# Patient Record
Sex: Male | Born: 1935 | Race: White | Hispanic: No | Smoking: Current some day smoker
Health system: Southern US, Community
[De-identification: ages and names within clinical notes are randomized; demographics above are authoritative.]

## PROBLEM LIST (undated history)

## (undated) DIAGNOSIS — I209 Angina pectoris, unspecified: Secondary | ICD-10-CM

## (undated) DIAGNOSIS — I639 Cerebral infarction, unspecified: Secondary | ICD-10-CM

## (undated) DIAGNOSIS — IMO0001 Reserved for inherently not codable concepts without codable children: Secondary | ICD-10-CM

## (undated) DIAGNOSIS — K219 Gastro-esophageal reflux disease without esophagitis: Secondary | ICD-10-CM

## (undated) DIAGNOSIS — I2510 Atherosclerotic heart disease of native coronary artery without angina pectoris: Secondary | ICD-10-CM

## (undated) DIAGNOSIS — Z789 Other specified health status: Secondary | ICD-10-CM

## (undated) DIAGNOSIS — I10 Essential (primary) hypertension: Secondary | ICD-10-CM

## (undated) DIAGNOSIS — J449 Chronic obstructive pulmonary disease, unspecified: Secondary | ICD-10-CM

## (undated) DIAGNOSIS — R911 Solitary pulmonary nodule: Secondary | ICD-10-CM

## (undated) HISTORY — PX: KNEE SURGERY: SHX244

## (undated) HISTORY — PX: BACK SURGERY: SHX140

## (undated) HISTORY — PX: OTHER SURGICAL HISTORY: SHX169

---

## 2009-04-05 ENCOUNTER — Emergency Department (HOSPITAL_COMMUNITY): Admission: EM | Admit: 2009-04-05 | Discharge: 2009-04-05 | Payer: Self-pay | Admitting: Emergency Medicine

## 2010-05-24 LAB — BASIC METABOLIC PANEL
CO2: 35 mEq/L — ABNORMAL HIGH (ref 19–32)
Calcium: 9.6 mg/dL (ref 8.4–10.5)
GFR calc non Af Amer: >60 mL/min (ref >60)
Glucose, Bld: 97 mg/dL (ref 70–99)

## 2010-05-24 LAB — CBC
HCT: 42.5 % (ref 39.0–52.0)
MCV: 94.8 fL (ref 78.0–100.0)
RBC: 4.48 MIL/uL (ref 4.22–5.81)
RDW: 13 % (ref 11.5–15.5)
WBC: 8.1 10*3/uL (ref 4.0–10.5)

## 2010-05-24 LAB — DIFFERENTIAL
Basophils Relative: 0 % (ref 0–1)
Eosinophils Relative: 1 % (ref 0–5)
Lymphocytes Relative: 24 % (ref 12–46)
Monocytes Absolute: 0.8 10*3/uL (ref 0.1–1.0)
Neutro Abs: 5.3 10*3/uL (ref 1.7–7.7)

## 2014-11-03 ENCOUNTER — Emergency Department (HOSPITAL_COMMUNITY): Payer: Medicare Other

## 2014-11-03 ENCOUNTER — Inpatient Hospital Stay (HOSPITAL_COMMUNITY)
Admission: EM | Admit: 2014-11-03 | Discharge: 2014-11-05 | DRG: 191 | Disposition: A | Discharge status: Home / Self Care | Payer: Medicare Other | Attending: Family Medicine | Admitting: Family Medicine

## 2014-11-03 ENCOUNTER — Encounter (HOSPITAL_COMMUNITY): Payer: Self-pay | Admitting: *Deleted

## 2014-11-03 DIAGNOSIS — J209 Acute bronchitis, unspecified: Secondary | ICD-10-CM | POA: Diagnosis present

## 2014-11-03 DIAGNOSIS — E876 Hypokalemia: Secondary | ICD-10-CM | POA: Diagnosis present

## 2014-11-03 DIAGNOSIS — Z720 Tobacco use: Secondary | ICD-10-CM

## 2014-11-03 DIAGNOSIS — R0789 Other chest pain: Secondary | ICD-10-CM | POA: Diagnosis not present

## 2014-11-03 DIAGNOSIS — J441 Chronic obstructive pulmonary disease with (acute) exacerbation: Secondary | ICD-10-CM | POA: Diagnosis present

## 2014-11-03 DIAGNOSIS — K219 Gastro-esophageal reflux disease without esophagitis: Secondary | ICD-10-CM | POA: Diagnosis present

## 2014-11-03 DIAGNOSIS — F17200 Nicotine dependence, unspecified, uncomplicated: Secondary | ICD-10-CM

## 2014-11-03 DIAGNOSIS — R918 Other nonspecific abnormal finding of lung field: Secondary | ICD-10-CM

## 2014-11-03 DIAGNOSIS — R911 Solitary pulmonary nodule: Secondary | ICD-10-CM

## 2014-11-03 DIAGNOSIS — I10 Essential (primary) hypertension: Secondary | ICD-10-CM | POA: Diagnosis present

## 2014-11-03 DIAGNOSIS — Z9981 Dependence on supplemental oxygen: Secondary | ICD-10-CM

## 2014-11-03 DIAGNOSIS — Z8673 Personal history of transient ischemic attack (TIA), and cerebral infarction without residual deficits: Secondary | ICD-10-CM | POA: Diagnosis not present

## 2014-11-03 DIAGNOSIS — F17210 Nicotine dependence, cigarettes, uncomplicated: Secondary | ICD-10-CM | POA: Diagnosis present

## 2014-11-03 DIAGNOSIS — J440 Chronic obstructive pulmonary disease with acute lower respiratory infection: Secondary | ICD-10-CM | POA: Diagnosis present

## 2014-11-03 DIAGNOSIS — R05 Cough: Secondary | ICD-10-CM | POA: Diagnosis not present

## 2014-11-03 DIAGNOSIS — Z809 Family history of malignant neoplasm, unspecified: Secondary | ICD-10-CM | POA: Diagnosis not present

## 2014-11-03 DIAGNOSIS — J9611 Chronic respiratory failure with hypoxia: Secondary | ICD-10-CM

## 2014-11-03 HISTORY — DX: Cerebral infarction, unspecified: I63.9

## 2014-11-03 HISTORY — DX: Reserved for inherently not codable concepts without codable children: IMO0001

## 2014-11-03 HISTORY — DX: Essential (primary) hypertension: I10

## 2014-11-03 HISTORY — DX: Other specified health status: Z78.9

## 2014-11-03 HISTORY — DX: Solitary pulmonary nodule: R91.1

## 2014-11-03 HISTORY — DX: Gastro-esophageal reflux disease without esophagitis: K21.9

## 2014-11-03 HISTORY — DX: Angina pectoris, unspecified: I20.9

## 2014-11-03 HISTORY — DX: Chronic obstructive pulmonary disease, unspecified: J44.9

## 2014-11-03 HISTORY — DX: Atherosclerotic heart disease of native coronary artery without angina pectoris: I25.10

## 2014-11-03 LAB — CBC WITH DIFFERENTIAL/PLATELET
BASOS PCT: 0 % (ref 0–1)
Basophils Absolute: 0 10*3/uL (ref 0.0–0.1)
EOS ABS: 0.3 10*3/uL (ref 0.0–0.7)
EOS PCT: 2 % (ref 0–5)
HCT: 44.9 % (ref 39.0–52.0)
Hemoglobin: 15.4 g/dL (ref 13.0–17.0)
LYMPHS ABS: 2.5 10*3/uL (ref 0.7–4.0)
Lymphocytes Relative: 19 % (ref 12–46)
MCH: 31.8 pg (ref 26.0–34.0)
MCHC: 34.3 g/dL (ref 30.0–36.0)
MCV: 92.8 fL (ref 78.0–100.0)
MONO ABS: 1.6 10*3/uL — AB (ref 0.1–1.0)
MONOS PCT: 12 % (ref 3–12)
NEUTROS PCT: 67 % (ref 43–77)
Neutro Abs: 9.1 10*3/uL — ABNORMAL HIGH (ref 1.7–7.7)
PLATELETS: 207 10*3/uL (ref 150–400)
RBC: 4.84 MIL/uL (ref 4.22–5.81)
RDW: 13.4 % (ref 11.5–15.5)
WBC: 13.4 10*3/uL — ABNORMAL HIGH (ref 4.0–10.5)

## 2014-11-03 LAB — BASIC METABOLIC PANEL
Anion gap: 10 (ref 5–15)
BUN: 15 mg/dL (ref 6–20)
CALCIUM: 9.6 mg/dL (ref 8.9–10.3)
CHLORIDE: 93 mmol/L — AB (ref 101–111)
CO2: 36 mmol/L — ABNORMAL HIGH (ref 22–32)
CREATININE: 0.78 mg/dL (ref 0.61–1.24)
GFR calc non Af Amer: >60 mL/min (ref >60)
Glucose, Bld: 105 mg/dL — ABNORMAL HIGH (ref 65–99)
Potassium: 3.3 mmol/L — ABNORMAL LOW (ref 3.5–5.1)
SODIUM: 139 mmol/L (ref 135–145)

## 2014-11-03 LAB — BRAIN NATRIURETIC PEPTIDE: B Natriuretic Peptide: 97 pg/mL (ref 0.0–100.0)

## 2014-11-03 LAB — TROPONIN I: Troponin I: <0.03 ng/mL (ref <0.031)

## 2014-11-03 MED ORDER — POTASSIUM CHLORIDE CRYS ER 20 MEQ PO TBCR
40.0000 meq | EXTENDED_RELEASE_TABLET | Freq: Once | ORAL | Status: AC
  Administered 2014-11-03: 40 meq via ORAL
  Filled 2014-11-03: qty 2

## 2014-11-03 MED ORDER — PANTOPRAZOLE SODIUM 40 MG PO TBEC
40.0000 mg | DELAYED_RELEASE_TABLET | Freq: Every day | ORAL | Status: DC
  Administered 2014-11-03 – 2014-11-05 (×3): 40 mg via ORAL
  Filled 2014-11-03 (×3): qty 1

## 2014-11-03 MED ORDER — HYDROCOD POLST-CPM POLST ER 10-8 MG/5ML PO SUER
5.0000 mL | Freq: Once | ORAL | Status: AC
  Administered 2014-11-03: 5 mL via ORAL
  Filled 2014-11-03: qty 5

## 2014-11-03 MED ORDER — INFLUENZA VAC SPLIT QUAD 0.5 ML IM SUSY
0.5000 mL | PREFILLED_SYRINGE | INTRAMUSCULAR | Status: AC
  Administered 2014-11-04: 0.5 mL via INTRAMUSCULAR
  Filled 2014-11-03: qty 0.5

## 2014-11-03 MED ORDER — SODIUM CHLORIDE 0.9 % IV SOLN: 250.0000 mL | INTRAVENOUS | Status: DC | PRN

## 2014-11-03 MED ORDER — IPRATROPIUM-ALBUTEROL 0.5-2.5 (3) MG/3ML IN SOLN
3.0000 mL | Freq: Once | RESPIRATORY_TRACT | Status: AC
  Administered 2014-11-03: 3 mL via RESPIRATORY_TRACT
  Filled 2014-11-03: qty 3

## 2014-11-03 MED ORDER — SODIUM CHLORIDE 0.9 % IJ SOLN
3.0000 mL | Freq: Two times a day (BID) | INTRAMUSCULAR | Status: DC
  Administered 2014-11-03 – 2014-11-04 (×4): 3 mL via INTRAVENOUS

## 2014-11-03 MED ORDER — ALBUTEROL SULFATE (2.5 MG/3ML) 0.083% IN NEBU
2.5000 mg | INHALATION_SOLUTION | Freq: Once | RESPIRATORY_TRACT | Status: DC
Start: 2014-11-03 — End: 2014-11-03
  Filled 2014-11-03: qty 3

## 2014-11-03 MED ORDER — SODIUM CHLORIDE 0.9 % IJ SOLN: 3.0000 mL | INTRAMUSCULAR | Status: DC | PRN

## 2014-11-03 MED ORDER — DOXYCYCLINE HYCLATE 100 MG PO TABS
100.0000 mg | ORAL_TABLET | Freq: Two times a day (BID) | ORAL | Status: DC
  Administered 2014-11-03 – 2014-11-05 (×5): 100 mg via ORAL
  Filled 2014-11-03 (×5): qty 1

## 2014-11-03 MED ORDER — ACETAMINOPHEN 325 MG PO TABS: 650.0000 mg | ORAL_TABLET | Freq: Four times a day (QID) | ORAL | Status: DC | PRN

## 2014-11-03 MED ORDER — GUAIFENESIN-DM 100-10 MG/5ML PO SYRP: 5.0000 mL | ORAL_SOLUTION | ORAL | Status: DC | PRN

## 2014-11-03 MED ORDER — METHYLPREDNISOLONE SODIUM SUCC 40 MG IJ SOLR
40.0000 mg | Freq: Two times a day (BID) | INTRAMUSCULAR | Status: DC
  Administered 2014-11-03 – 2014-11-05 (×4): 40 mg via INTRAVENOUS
  Filled 2014-11-03 (×4): qty 1

## 2014-11-03 MED ORDER — ALBUTEROL SULFATE (2.5 MG/3ML) 0.083% IN NEBU: 2.5000 mg | INHALATION_SOLUTION | RESPIRATORY_TRACT | Status: DC | PRN

## 2014-11-03 MED ORDER — ACETAMINOPHEN 650 MG RE SUPP: 650.0000 mg | Freq: Four times a day (QID) | RECTAL | Status: DC | PRN

## 2014-11-03 MED ORDER — IOHEXOL 300 MG/ML  SOLN
75.0000 mL | Freq: Once | INTRAMUSCULAR | Status: AC | PRN
  Administered 2014-11-03: 75 mL via INTRAVENOUS

## 2014-11-03 MED ORDER — METHYLPREDNISOLONE SODIUM SUCC 125 MG IJ SOLR
125.0000 mg | Freq: Once | INTRAMUSCULAR | Status: AC
Start: 2014-11-03 — End: 2014-11-03
  Administered 2014-11-03: 125 mg via INTRAVENOUS
  Filled 2014-11-03: qty 2

## 2014-11-03 MED ORDER — IPRATROPIUM-ALBUTEROL 0.5-2.5 (3) MG/3ML IN SOLN
3.0000 mL | Freq: Four times a day (QID) | RESPIRATORY_TRACT | Status: DC
  Administered 2014-11-03 – 2014-11-05 (×8): 3 mL via RESPIRATORY_TRACT
  Filled 2014-11-03 (×9): qty 3

## 2014-11-03 NOTE — H&P (Signed)
History and Physical  Marc Riley GEX:[id number] DOB: 06/22/1933 DOA: 11/03/2014  Referring physician: Ivery Quale, PA-C PCP: Joya Salm   Chief Complaint: Chest pain and cough  HPI: 27 yom PMH COPD presented with complaints of productive cough, SOB and chest pain. In the ED he was found to be mildly hypoxic. Also imaging revealed focal irregular nodular opacities in the apical segment of the right upper lung and several small on nodular opacities of the right and left lung. Will be admitted for further evaluation and treatment.  Reports chronic COPD, intermittent smoking, nocturnal oxygen use at home. Productive cough with chest pain onset last week with increasing shortness of breath over the last 3 days. The pain was described as cramp like, with a severity of 7-8/10, intermittent lasting a few seconds starting in his chest and radiating to the left arm in each episode. Has bilateral rib pain. Breathing treatments ineffective, but he does state when he gets sick he does not take medications as prescribed. Checks O2 at home and noted it was low. He complains of subjective fever, sore throat, leg pain. Denies n/v/d dysuria, abd pain, diarrhea.  In the emergency department VSS, Not hypoxic, and 87% on RA placed on 3L [place] with improvement to 95% Pertinent labs: Potassium 3.3, Glucose 105, WBC 13.4, BNP normal EKG: Independently reviewed. SR no acute changes Imaging: independently reviewed.  CT chest Underlying emphysema... focal irregular nodular opacity in the apical segment right upper lobe measuring 1.5 x 1.3 cm with a nearby 7 mm nodular opacity. Neoplasm in this area is of concern.   Review of Systems:  Positive for chest pain, SOB  Negative for fever, visual changes, sore throat, rash, new muscle aches, dysuria, bleeding, n/v/abdominal pain.  Past Medical History  Diagnosis Date  . Hypertension   . Reflux   . Angina pectoris   . Poor historian   . CAD (coronary artery  disease)   . Stroke     affected right eye vision  . COPD (chronic obstructive pulmonary disease)     on oxygen at night     Past Surgical History  Procedure Laterality Date  . Knee surgery    . Back surgery    . Left ear      Social History:  reports that he has been smoking Cigarettes.  He has been smoking about 0.50 packs per day. His smokeless tobacco use includes Chew. He reports that he drinks alcohol. His drug history is not on file. lives with their spouse Self-care  Allergies  Allergen Reactions  . Ivp Dye [Iodinated Diagnostic Agents] Hives and Rash    Family History  Problem Relation Age of Onset  . Cancer Brother   . Cancer Brother   . Cancer Brother   . Cancer Mother      Prior to Admission medications   Not on File  Cannot recall medications  Physical Exam: Filed Vitals:   11/03/14 0854 11/03/14 0900 11/03/14 1000 11/03/14 1100  BP:  110/56 152/64 135/61  Pulse:  59 71 80  Temp:      TempSrc:      Resp:  13 20   Height:      Weight:      SpO2: 87% 95% 93% 93%   General: Appears calm and comfortable Eyes: PERRL, normal lids, irises & conjunctiva. Wears glasses. ENT: grossly normal hearing, lips & tongue Neck: no LAD, masses or thyromegaly Cardiovascular: Distant RRR, no m/r/g. No LE edema. Respiratory: Wheezes all lung fields.  No r/r. Speaks in full sentences, normal respiratory effort. Abdomen: soft, ntnd Skin: no rash or induration noted Musculoskeletal: grossly normal tone BUE/BLE Psychiatric: grossly normal mood and affect, speech fluent and appropriate   Wt Readings from Last 3 Encounters:  11/03/14 92.08 kg (203 lb)    Labs on Admission:  Basic Metabolic Panel:  Recent Labs Lab 11/03/14 0830  NA 139  K 3.3*  CL 93*  CO2 36*  GLUCOSE 105*  BUN 15  CREATININE 0.78  CALCIUM 9.6   CBC:  Recent Labs Lab 11/03/14 0830  WBC 13.4*  NEUTROABS 9.1*  HGB 15.4  HCT 44.9  MCV 92.8  PLT 207    Radiological Exams on  Admission: Dg Chest 2 View  11/03/2014   CLINICAL DATA:  Productive cough. Congestion. Shortness of breath. Wheezing for 4 days. Worsening over the past 2 days.  EXAM: CHEST  2 VIEW  COMPARISON:  To 04/05/2009  FINDINGS: Healed right posterior third and fourth rib fractures.  Atherosclerotic calcification of the aortic arch.  Thoracic spondylosis.  Mild bilateral interstitial accentuation, possible underlying emphysema. 1.2 cm nodular density projects over approximately the T10 vertebra on the lateral projection, not well correlated on the frontal projection.  IMPRESSION: 1. New 1.2 cm nodular density projects over the lower lobes on the lateral projection. Possibly a calcified granuloma or calcified structure, but I cannot completely exclude an intrapulmonary nodule. CT the chest is recommended for further workup to exclude malignancy. 2. Suspected emphysema.  Faint interstitial accentuation. 3. Healed right upper rib fractures.   Electronically Signed   By: Gaylyn Rong M.D.   On: 11/03/2014 08:34   Ct Chest W Contrast  11/03/2014   CLINICAL DATA:  Productive cough and chest pain. Nodular opacity seen on chest radiograph  EXAM: CT CHEST WITH CONTRAST  TECHNIQUE: Multidetector CT imaging of the chest was performed during intravenous contrast administration.  CONTRAST:  75mL OMNIPAQUE IOHEXOL 300 MG/ML  SOLN  COMPARISON:  Chest radiograph November 03, 2014  FINDINGS: There is underlying centrilobular emphysema. There is and irregular pulmonary nodular opacity in the apical  segment of the right upper lobe measuring 1.5 x 1.3 cm, best seen on axial slice 11 series 3. On axial slice 9 series 3, there is a 7 x 7 mm slightly irregular nodular opacity in the apical segment right upper lobe as well. On axial slice 10 series 3, there is a 2 mm nodular opacity in the apical segment of the right upper lobe. On axial slice 24 series 3, there is a 5 mm nodular opacity in the anterior segment of the left upper lobe. On  axial slice 34 series 3, there is a 3 mm nodular opacity in the anterior segment of the right upper lobe. On axial slice 39 series 3, there is a 2 mm nodular opacity in the superior segment of the right lower lobe. On axial slice 47 series 3, there is a 5 x 2 mm nodular opacity in the medial segment right middle lobe. There is pleural thickening and scarring in the anterior left base region. There are scattered areas of mild pleural thickening in the upper lobe regions.  There is no appreciable thoracic adenopathy. Thyroid appears normal. There is atherosclerotic change in the aorta without aneurysm or dissection. There are foci of coronary artery calcification. No major vessel pulmonary embolus. Pericardium is not thickened.  In the visualized upper abdomen, there is atherosclerotic change in aorta. Visualized upper abdominal structures otherwise appear unremarkable. Adrenals in  particular appear normal. There is degenerative change in thoracic spine. No blastic or lytic bone lesions are identified on this study.  IMPRESSION: Underlying emphysema. No nodular opacity is seen in the region of the nodular opacity noted on the chest radiograph. However, there is a focal irregular nodular opacity in the apical segment right upper lobe measuring 1.5 x 1.3 cm with a nearby 7 mm nodular opacity. Neoplasm in this area is of concern. Several smaller nodular opacities are of uncertain etiology but potentially could represent small neoplastic foci. The dominant mass in the right upper lobe apical segment may well warrant correlation with nuclear medicine PET study to further evaluate given its irregular contour.  No lung edema or consolidation. No adenopathy appreciable. No adrenal lesions. Atherosclerotic change with foci of coronary artery calcification noted.   Electronically Signed   By: Bretta Bang III M.D.   On: 11/03/2014 10:13      Principal Problem:   COPD exacerbation Active Problems:   Chronic  respiratory failure with hypoxia   Atypical chest pain   Lung nodule   Tobacco use disorder   Assessment/Plan 1. COPD exacerbation with acute bronchitis. No evidence of pneumonia or edema on chest CT. 2. Chronic hypoxic respiratory failure on [place] at home. 3. Atypical chest pain, EKG non acute, no history to suggest ACS. Suspect related to cough. 4. Hypokalemia  5. Lung nodule RUL with concern for neoplasm. Consider PET study as an outpatient. 6. Tobacco use disorder   Plan admission to medical floor  Steroids, supplemental oxygen, abx  Telemetry, check troponin  Replete potassium   Outpatient follow-up for lung nodule  Code Status: Full  DVT prophylaxis:SCDs Family Communication: Discussed with patient who understands and has no concerns at this time. Disposition Plan/Anticipated LOS: Admitted to medical bed.  Time spent: 50 minutes  [name], MD  [facility] Pager [phone] 11/03/2014, 11:34 AM

## 2014-11-03 NOTE — ED Provider Notes (Signed)
CSN: [id number]     Arrival date & time 11/03/14  0751 History   First MD Initiated Contact with Patient 11/03/14 [phone]     Chief Complaint  Patient presents with  . Chest Pain  . Cough     (Consider location/radiation/quality/duration/timing/severity/associated sxs/prior Treatment) HPI Comments: Patient is a 81 year old male who presents to the emergency department with a complaint of cough and chest pain.  The patient states that about 6 or 7 days ago he began having cold symptoms with nasal congestion, cough, and some wheezing. He states he has a history of COPD per his physician in [place]. He states this is been getting progressively worse. He now has cough, with white to yellow phlegm. He states he has not measured a temperature elevation, but has had episodes of sweating and some chills. He now complains of soreness in his ribs on, as well as a pressure sensation across his chest that his aggravated by cough. He denies any syncopal episodes. He denies any hemoptysis. He denies any swelling of his lower extremities. He reports history of injury to his chest area with some broken ribs, but otherwise no surgical procedures or injury to the chest or lungs. It is of note that he has been diagnosed in the past with angina.  The history is provided by the patient and a friend.    Past Medical History  Diagnosis Date  . Hypertension   . Reflux   . Angina pectoris   . Poor historian    Past Surgical History  Procedure Laterality Date  . Knee surgery    . Back surgery     No family history on file. Social History  Substance Use Topics  . Smoking status: Current Every Day Smoker -- 0.50 packs/day    Types: Cigarettes  . Smokeless tobacco: Current User    Types: Chew  . Alcohol Use: Yes     Comment: seldom    Review of Systems  Constitutional: Positive for chills and appetite change.  HENT: Positive for congestion.   Respiratory: Positive for cough and chest tightness.    Musculoskeletal: Positive for back pain and arthralgias.  All other systems reviewed and are negative.     Allergies  Review of patient's allergies indicates not on file.  Home Medications   Prior to Admission medications   Not on File   BP 157/72 mmHg  Pulse 76  Temp(Src) 98.7 F (37.1 C) (Oral)  Resp 24  Ht 6' (1.829 m)  Wt 203 lb (92.08 kg)  BMI 27.53 kg/m2  SpO2 95% Physical Exam  Constitutional: He is oriented to person, place, and time. He appears well-developed and well-nourished.  Non-toxic appearance.  HENT:  Head: Normocephalic.  Right Ear: Tympanic membrane and external ear normal.  Left Ear: Tympanic membrane and external ear normal.  Mild congestion present.  Eyes: EOM and lids are normal. Pupils are equal, round, and reactive to light.  Neck: Normal range of motion. Neck supple. Carotid bruit is not present.  Cardiovascular: Normal rate, regular rhythm, intact distal pulses and normal pulses.   Murmur heard. 2/6 systolic murmur appreciated. No rub appreciated. No gallop noted.  Pulmonary/Chest: Breath sounds normal. No respiratory distress.  There is symmetrical rise and fall of the chest. The patient speaks in complete sentences. There are bilateral wheezes and some rhonchi scattered. There is soreness with deep breathing, and soreness to palpation of the right and left lung areas.   Abdominal: Soft. Bowel sounds are normal. He exhibits  no distension and no mass. There is no tenderness. There is no guarding.  Musculoskeletal: Normal range of motion. He exhibits edema and tenderness.  Negative Homans sign  Lymphadenopathy:       Head (right side): No submandibular adenopathy present.       Head (left side): No submandibular adenopathy present.    He has no cervical adenopathy.  Neurological: He is alert and oriented to person, place, and time. He has normal strength. No cranial nerve deficit or sensory deficit.  Gait is slow but steady. No gross  neurologic deficits appreciated.  Skin: Skin is warm and dry.  Psychiatric: He has a normal mood and affect. His speech is normal.  Nursing note and vitals reviewed.   ED Course  Procedures (including critical care time) Labs Review Labs Reviewed - No data to display  Imaging Review No results found. I have personally reviewed and evaluated these images and lab results as part of my medical decision-making.   EKG Interpretation None      MDM  Vital signs reviewed. Pulse oximetry low at 87% on room air. Patient improved to 94-95% on 3 L of oxygen by nasal cannula.  Complete blood count reveals an elevated white blood cell count of 13,400. There is no noted shift to the left. The chest x-ray suggest a 1.2 cm nodular density projecting over the lower lobes of the lateral aspect of the lung. There is also suspected emphysema with faint interstitial accentuation. A CT scan of the chest is suggested for further workup concerning this nodular density.   The potassium is slightly low at 3.3, the CO2 is high at 36, the remainder of the basic metabolic panels within normal limits. The CT scan suggests underlying emphysema. There are focal irregular nodular opacities in the apical segment of the right upper lung. And there is question of neoplasm in this area. There is several small on nodular opacities of the right and left lung also of some concern. There is no lung edema appreciated. There is calcification noted in the coronary arteries, but no aneurysmal changes appreciated.  I have discussed these findings with the patient and the patient's family/friend in terms which they understand. Of concern is the fact that the patient had a hypoxia of 87% on room air on admission, and has required oxygen by nasal cannula during his stay here in the emergency department. This is compounded by the fact that the patient has documented lesions in the right and left lung, with mild CO2 retention, and advanced  age. We'll discuss the patient with hospitalist for admission.    Final diagnoses:  COPD exacerbation  Mass of lung    *I have reviewed nursing notes, vital signs, and all appropriate lab and imaging results for this patient.[address], PA-C 11/03/14 1144  Benjiman Core, MD 11/04/14 1159

## 2014-11-03 NOTE — ED Notes (Signed)
Pt states cold symptoms began ~6 days ago. States productive cough at times, yellow in color. Pt states pressure across chest x 2 days along with rib pain.

## 2014-11-04 DIAGNOSIS — J441 Chronic obstructive pulmonary disease with (acute) exacerbation: Secondary | ICD-10-CM | POA: Diagnosis not present

## 2014-11-04 LAB — TROPONIN I

## 2014-11-04 MED ORDER — POLYETHYLENE GLYCOL 3350 17 G PO PACK
17.0000 g | PACK | Freq: Two times a day (BID) | ORAL | Status: DC
  Filled 2014-11-04 (×2): qty 1

## 2014-11-04 NOTE — Care Management Important Message (Signed)
Important Message  Patient Details  Name: Marc Riley MRN: [id number] Date of Birth: April 25, 1933   Medicare Important Message Given:  Yes-second notification given    Malcolm Metro, RN 11/04/2014, 1:27 PM

## 2014-11-04 NOTE — Progress Notes (Signed)
PROGRESS NOTE  Marc Riley ZOX:[id number] DOB: [birth year] DOA: 11/03/2014 PCP: Joya Salm  Summary: 24 yom PMH COPD presented with complaints of productive cough, SOB and chest pain. In the ED he was found to be mildly hypoxic. Also imaging revealed focal irregular nodular opacities in the apical segment of the right upper lung and several small on nodular opacities of the right and left lung. Will be admitted for further evaluation and treatment.  Assessment/Plan: 1. COPD exacerbation with acute bronchitis, no change today. No evidence of pneumonia or edema on chest CT. 2. Chronic hypoxic respiratory failure on [place] at home. Stable. 3. Atypical chest pain, EKG non acute, no history to suggest ACS. Suspect related to cough. Troponin negative and telemetry unrevealing. 4. Lung nodule RUL with concern for neoplasm. Consider PET study as an outpatient. 5. Tobacco use disorder   Somewhat worse today. Continue abx, steroids, and bronchodilators.   Discontinue telemetry.  Anticipate will need 48 hours before discharge.  Code Status: Full  DVT prophylaxis: SCDs Family Communication: Discussed with patient who understands and has no concerns at this time.  Disposition Plan: Anticipate will need 48 hours before discharge.  [name], MD  [facility]  Pager [phone] If 7PM-7AM, please contact night-coverage at [email or web address], password [facility] 11/04/2014, 6:38 AM  LOS: 1 day   Consultants:    Procedures:    Antibiotics:  Doxycycline 9/1>>  HPI/Subjective: Feels worse. Cough has increased without being able to bring up anything, His chest feels tight and SOB is worse.   Objective: Filed Vitals:   11/03/14 1647 11/03/14 2015 11/03/14 2142 11/04/14 0409  BP:   121/57 118/61  Pulse:   91 78  Temp:   98.1 F (36.7 C) 97.9 F (36.6 C)  TempSrc:   Oral Oral  Resp:   18 18  Height:      Weight:      SpO2: 98% 95% 96% 89%    Intake/Output Summary (Last 24 hours)  at 11/04/14 8295 Last data filed at 11/03/14 2259  Gross per 24 hour  Intake    243 ml  Output      0 ml  Net    243 ml     Filed Weights   11/03/14 0800 11/03/14 1526  Weight: 92.08 kg (203 lb) 88.315 kg (194 lb 11.2 oz)    Exam:  Afebrile, mild hypoxia on   General:  Appears calm and comfortable Cardiovascular: RRR, no m/r/g. No LE edema. Telemetry: SR Respiratory: Diffuse wheezes on all lung fields, poor air movement and mild to moderate increased respiratory efforts. No r/r. Psychiatric: grossly normal mood and affect, speech fluent and appropriate  New data reviewed:  Troponin negative  Pertinent data since admission:  CT Chest underlying emphysema. Focal irregular nodular opacity in the apical segment right upper lobe measuring 1.5 x 1.3 cm with a nearby 7 mm nodular opacity.   CXR: New 1.2 cm nodular density projects over the lower lobes on the lateral projection. Possible a calcified granuloma or calcified structure. Cannot completely exclude and intrapulmonary nodule. 2. Suspect emphysema. Faint interstitial asscentuation. 3. Healed right upper rib fracture.   Pending data:    Scheduled Meds: . doxycycline  100 mg Oral Q12H  . Influenza vac split quadrivalent PF  0.5 mL Intramuscular Tomorrow-1000  . ipratropium-albuterol  3 mL Nebulization QID  . methylPREDNISolone (SOLU-MEDROL) injection  40 mg Intravenous Q12H  . pantoprazole  40 mg Oral Daily  . sodium chloride  3 mL Intravenous Q12H  Continuous Infusions:   Principal Problem:   COPD exacerbation Active Problems:   Chronic respiratory failure with hypoxia   Atypical chest pain   Lung nodule   Tobacco use disorder   Time spent 25 minutes  I, Jessica D. Jari Pigg, acting as scribe, recorded this note contemporaneously in the presence of Dr. Melton Alar. Irene Limbo, M.D. on 11/04/2014 .  I have reviewed the above documentation for accuracy and completeness, and I agree with the above. [name], MD

## 2014-11-04 NOTE — Care Management Note (Signed)
Case Management Note  Patient Details  Name: Annette Liotta MRN: [id number] Date of Birth: 18-Dec-1933  Expected Discharge Date:    11/07/2014              Expected Discharge Plan:  Home/Self Care  In-House Referral:  NA  Discharge planning Services  CM Consult  Post Acute Care Choice:  NA Choice offered to:  NA  DME Arranged:    DME Agency:     HH Arranged:    HH Agency:     Status of Service:  Completed, signed off  Medicare Important Message Given:    Date Medicare IM Given:    Medicare IM give by:    Date Additional Medicare IM Given:    Additional Medicare Important Message give by:     If discussed at Long Length of Stay Meetings, dates discussed:    Additional Comments: Pt is from home, lives with his daughter and is independent at baseline. Pt has home O2, neb machine and walker. Pt plans to return home with self care at DC. DC is anticipated over weekend. No CM needs noted.  Malcolm Metro, RN 11/04/2014, 1:26 PM

## 2014-11-05 MED ORDER — DOXYCYCLINE HYCLATE 100 MG PO TABS: 100.0000 mg | ORAL_TABLET | Freq: Two times a day (BID) | ORAL | Status: AC

## 2014-11-05 MED ORDER — PREDNISONE 10 MG PO TABS: ORAL_TABLET | ORAL | Status: AC

## 2014-11-05 MED ORDER — ALBUTEROL SULFATE HFA 108 (90 BASE) MCG/ACT IN AERS
2.0000 | INHALATION_SPRAY | Freq: Four times a day (QID) | RESPIRATORY_TRACT | Status: AC | PRN
Start: 2014-11-05 — End: ?

## 2014-11-05 MED ORDER — TIOTROPIUM BROMIDE MONOHYDRATE 18 MCG IN CAPS: 18.0000 ug | ORAL_CAPSULE | Freq: Every day | RESPIRATORY_TRACT | Status: AC

## 2014-11-05 NOTE — Discharge Summary (Signed)
Physician Discharge Summary  Marc Riley ZOX:[id number] DOB: 03-08-1933 DOA: 11/03/2014  PCP: Marc Riley  Admit date: 11/03/2014 Discharge date: 11/05/2014  Recommendations for Outpatient Follow-up:   Follow up with PCP in 1 week for resolution of COPD exacerbation and for reported BLE edema at home. Will hold Norvasc for now. Finish oral Doxycycline 9/7 and steroid taper.   Continue supplemental oxygen at home.  Consider PET study as an outpatient for RUL nodule with some concern for malignancy.   Follow-up Information    Follow up with Riley, Marc. Schedule an appointment as soon as possible for a visit in 1 week.   Specialty:  Family Medicine   Contact information:   [address] [place] [address] [phone]      Discharge Diagnoses:  1. COPD exacerbation with acute bronchitis, 2. Chronic hypoxic respiratory failure on [place] at home.. 3. Atypical chest pain 4. Lung nodule RUL with concern for neoplasm.  5. Tobacco use disorder  Discharge Condition: Improved  Disposition: Home  Diet recommendation: Heart healthy   Filed Weights   11/03/14 0800 11/03/14 1526  Weight: 92.08 kg (203 lb) 88.315 kg (194 lb 11.2 oz)    History of present illness:  87 yom PMH COPD presented with complaints of productive cough, SOB and chest pain. In the ED he was found to be mildly hypoxic. Also imaging revealed focal irregular nodular opacities in the apical segment of the right upper lung and several small on nodular opacities of the right and left lung. Will be admitted for further evaluation and treatment.  Hospital Course:  Marc Riley was treated with doxcycline, steroids, and bronchodilators with gradual improvement in COPD exacerbation. Atypical chest pain was suspected to be due to cough, there was no history to suggest ACS and all other cardiac workup was unremarkable. Reported BLE edema at home but spontaneously resolved with holding of Norvasc. Will discontinue  Norvasc as outpatient until follow up with PCP for further evaluation. RUL nodule noted with concern for neoplasm, consider PET study as outpatient. All other issues remained stable.   Individual issues as below:  1. COPD exacerbation with acute bronchitis, improving. No evidence of pneumonia or edema on chest CT. 2. Chronic hypoxic respiratory failure on [place] at home. Remains stable. 3. Atypical chest pain, EKG non acute, no history to suggest ACS. Suspect related to cough. Troponin negative and telemetry unrevealing. No further evaluation suggested.  4. Lung nodule RUL with concern for neoplasm. Consider PET study as an outpatient. 5. Tobacco use disorder. Counseled on tobacco cessation.  Consultants:  None   Procedures:  None  Antibiotics:  Doxycycline 9/1>> 9/7  Discharge Instructions Discharge Instructions    Activity as tolerated - No restrictions    Complete by:  As directed      Diet general    Complete by:  As directed      Discharge instructions    Complete by:  As directed   Call your physician or seek immediate medical attention for shortness of breath, increased wheezing, fever, weakness or worsening of condition.            Current Discharge Medication List    START taking these medications   Details  albuterol (PROVENTIL HFA;VENTOLIN HFA) 108 (90 BASE) MCG/ACT inhaler Inhale 2 puffs into the lungs every 6 (six) hours as needed for wheezing or shortness of breath. Please instruct in proper technique Qty: 1 Inhaler, Refills: 0    doxycycline (VIBRA-TABS) 100 MG tablet Take 1  tablet (100 mg total) by mouth every 12 (twelve) hours. Qty: 9 tablet, Refills: 0    predniSONE (DELTASONE) 10 MG tablet Take 40 mg by mouth daily for 3 days, then take 20 mg by mouth daily for 3 days, then take 10 mg by mouth daily for 3 days, then stop. Qty: 21 tablet, Refills: 0    tiotropium (SPIRIVA HANDIHALER) 18 MCG inhalation capsule Place 1 capsule (18 mcg total) into inhaler  and inhale daily. Instruct in proper technique. Qty: 30 capsule, Refills: 0      CONTINUE these medications which have NOT CHANGED   Details  atorvastatin (LIPITOR) 20 MG tablet Take 1 tablet by mouth daily.    clonazePAM (KLONOPIN) 0.5 MG tablet Take 1-2 tablets by mouth at bedtime.     diclofenac (CATAFLAM) 50 MG tablet Take 1 tablet by mouth 3 (three) times daily as needed (pain).     DULoxetine (CYMBALTA) 20 MG capsule Take 1 capsule by mouth at bedtime.     hydrochlorothiazide (HYDRODIURIL) 25 MG tablet Take 1 tablet by mouth daily.    LIDOCAINE EX Apply 1 application topically 4 (four) times daily as needed (pain). Compounded with lidocaine, gabapentin, and ketoprofen.    methocarbamol (ROBAXIN) 750 MG tablet Take 750 mg by mouth 2 (two) times daily.     metoprolol succinate (TOPROL-XL) 50 MG 24 hr tablet Take 50 mg by mouth daily. Take with or immediately following a meal.    omeprazole (PRILOSEC) 20 MG capsule Take 1 capsule by mouth daily.      STOP taking these medications     amLODipine (NORVASC) 10 MG tablet        Allergies  Allergen Reactions  . Ivp Dye [Iodinated Diagnostic Agents] Hives and Rash    The results of significant diagnostics from this hospitalization (including imaging, microbiology, ancillary and laboratory) are listed below for reference.    Significant Diagnostic Studies: Dg Chest 2 View  11/03/2014   CLINICAL DATA:  Productive cough. Congestion. Shortness of breath. Wheezing for 4 days. Worsening over the past 2 days.  EXAM: CHEST  2 VIEW  COMPARISON:  To 04/05/2009  FINDINGS: Healed right posterior third and fourth rib fractures.  Atherosclerotic calcification of the aortic arch.  Thoracic spondylosis.  Mild bilateral interstitial accentuation, possible underlying emphysema. 1.2 cm nodular density projects over approximately the T10 vertebra on the lateral projection, not well correlated on the frontal projection.  IMPRESSION: 1. New 1.2 cm  nodular density projects over the lower lobes on the lateral projection. Possibly a calcified granuloma or calcified structure, but I cannot completely exclude an intrapulmonary nodule. CT the chest is recommended for further workup to exclude malignancy. 2. Suspected emphysema.  Faint interstitial accentuation. 3. Healed right upper rib fractures.   Electronically Signed   By: Walter  Liebkemann M.D.   On: 11/03/2014 08:34   Ct Chest W Contrast  11/03/2014   CLINICAL DATA:  Productive cough and chest pain. Nodular opacity seen on chest radiograph  EXAM: CT CHEST WITH CONTRAST  TECHNIQUE: Multidetector CT imaging of the chest was performed during intravenous contrast administration.  CONTRAST:  43mRenaLon54mlRenaLon3mlRenaLon5mlRenaLon61mlRenaLon63mlRenaLon54mlRenaLon52mlRenaLon54mlRenaLon36mlRenaLon31mlRenaLon81mlRenaLon13mlRenaLon61mlRenaLon73mlRenaLon19mlRenaLon11mlRenaLon62mlRenaLonellLoren RacerIOHEXOL 300 MG/ML  SOLN  COMPARISON:  Chest radiograph November 03, 2014  FINDINGS: There is underlying centrilobular emphysema. There is and irregular pulmonary nodular opacity in the apical  segment of the right upper lobe measuring 1.5 x 1.3 cm, best seen on axial slice 11 series 3. On axial slice 9 series 3, there is a 7 x 7  mm slightly irregular nodular opacity in the apical segment right upper lobe as well. On axial slice 10 series 3, there is a 2 mm nodular opacity in the apical segment of the right upper lobe. On axial slice 24 series 3, there is a 5 mm nodular opacity in the anterior segment of the left upper lobe. On axial slice 34 series 3, there is a 3 mm nodular opacity in the anterior segment of the right upper lobe. On axial slice 39 series 3, there is a 2 mm nodular opacity in the superior segment of the right lower lobe. On axial slice 47 series 3, there is a 5 x 2 mm nodular opacity in the medial segment right middle lobe. There is pleural thickening and scarring in the anterior left base region. There are scattered areas of mild pleural thickening in the upper lobe regions.  There is no appreciable thoracic adenopathy. Thyroid appears normal. There is atherosclerotic change in the aorta  without aneurysm or dissection. There are foci of coronary artery calcification. No major vessel pulmonary embolus. Pericardium is not thickened.  In the visualized upper abdomen, there is atherosclerotic change in aorta. Visualized upper abdominal structures otherwise appear unremarkable. Adrenals in particular appear normal. There is degenerative change in thoracic spine. No blastic or lytic bone lesions are identified on this study.  IMPRESSION: Underlying emphysema. No nodular opacity is seen in the region of the nodular opacity noted on the chest radiograph. However, there is a focal irregular nodular opacity in the apical segment right upper lobe measuring 1.5 x 1.3 cm with a nearby 7 mm nodular opacity. Neoplasm in this area is of concern. Several smaller nodular opacities are of uncertain etiology but potentially could represent small neoplastic foci. The dominant mass in the right upper lobe apical segment may well warrant correlation with nuclear medicine PET study to further evaluate given its irregular contour.  No lung edema or consolidation. No adenopathy appreciable. No adrenal lesions. Atherosclerotic change with foci of coronary artery calcification noted.   Electronically Signed   By: Bretta Bang III M.D.   On: 11/03/2014 10:13    Labs: Basic Metabolic Panel:  Recent Labs Lab 11/03/14 0830  NA 139  K 3.3*  CL 93*  CO2 36*  GLUCOSE 105*  BUN 15  CREATININE 0.78  CALCIUM 9.6  CBC:  Recent Labs Lab 11/03/14 0830  WBC 13.4*  NEUTROABS 9.1*  HGB 15.4  HCT 44.9  MCV 92.8  PLT 207   Cardiac Enzymes:  Recent Labs Lab 11/03/14 0830 11/03/14 2047 11/04/14 0316  TROPONINI <0.03 <0.03 <0.03      Recent Labs  11/03/14 0830  BNP 97.0   Principal Problem:   COPD exacerbation Active Problems:   Chronic respiratory failure with hypoxia   Atypical chest pain   Lung nodule   Tobacco use disorder   Time coordinating discharge: 35 minutes   Signed:  [name], MD [facility] 11/05/2014, 10:21 AM   I, Princella Pellegrini. Jari Pigg, acting as scribe, recorded this note contemporaneously in the presence of Dr. Melton Alar. Irene Limbo, M.D. on 11/05/2014 .  I have reviewed the above documentation for accuracy and completeness, and I agree with the above. [name], MD

## 2014-11-05 NOTE — Progress Notes (Signed)
PROGRESS NOTE  Marc Riley ZOX:[id number] DOB: [birth year] DOA: 11/03/2014 PCP: Joya Salm  Summary: 89 yom PMH COPD presented with complaints of productive cough, SOB and chest pain. In the ED he was found to be mildly hypoxic. Also imaging revealed focal irregular nodular opacities in the apical segment of the right upper lung and several small on nodular opacities of the right and left lung. Will be admitted for further evaluation and treatment.  Assessment/Plan: 1. COPD exacerbation with acute bronchitis, improving. No evidence of pneumonia or edema on chest CT. 2. Chronic hypoxic respiratory failure on [place] at home. Remains stable. 3. Atypical chest pain, EKG non acute, no history to suggest ACS. Suspect related to cough. Troponin negative and telemetry unrevealing. No further evaluation suggested.  4. Lung nodule RUL with concern for neoplasm. Consider PET study as an outpatient. 5. Tobacco use disorder. Counseled on tobacco cessation.    Overall improving.   Discharge home today on steriod taper  Continue supplemental oxygen at home.  Finish oral Doxycycline   Code Status: Full  DVT prophylaxis: SCDs Family Communication: Discussed with patient who understands and has no concerns at this time. Disposition Plan: Discharge home today  Marc Sacks, MD  [facility]  Pager [phone] If 7PM-7AM, please contact night-coverage at [email or web address], password [facility] 11/05/2014, 10:16 AM  LOS: 2 days   Consultants:    Procedures:    Antibiotics:  Doxycycline 9/1>> 9/7  HPI/Subjective: Feeling a lot better today and wants to go home. No reports of chest pain, n/v/d, or abd pain. Breathing ok. Ambulating to bathroom ok.  Objective: Filed Vitals:   11/04/14 2011 11/04/14 2049 11/05/14 0654 11/05/14 0854  BP:  150/60 133/69   Pulse:  95 67   Temp:  97.8 F (36.6 C) 97.7 F (36.5 C)   TempSrc:  Oral Oral   Resp:  18 18   Height:      Weight:      SpO2: 89%  96% 99% 93%    Intake/Output Summary (Last 24 hours) at 11/05/14 1016 Last data filed at 11/05/14 0829  Gross per 24 hour  Intake   1320 ml  Output      0 ml  Net   1320 ml     Filed Weights   11/03/14 0800 11/03/14 1526  Weight: 92.08 kg (203 lb) 88.315 kg (194 lb 11.2 oz)    Exam:  Afebrile, VSS, no hypoxia  General:  Appears calm and comfortable Cardiovascular: RRR, no m/r/g. No LE edema. Respiratory:  Some wheezes but no r/r. Good air movement. Able to speak in full sentences.  Psychiatric: grossly normal mood and affect, speech fluent and appropriate  New data reviewed:  Troponin negative  Pertinent data since admission:  CT Chest underlying emphysema. Focal irregular nodular opacity in the apical segment right upper lobe measuring 1.5 x 1.3 cm with a nearby 7 mm nodular opacity.   CXR: New 1.2 cm nodular density projects over the lower lobes on the lateral projection. Possible a calcified granuloma or calcified structure. Cannot completely exclude and intrapulmonary nodule. 2. Suspect emphysema. Faint interstitial asscentuation. 3. Healed right upper rib fracture.   Pending data:    Scheduled Meds: . doxycycline  100 mg Oral Q12H  . ipratropium-albuterol  3 mL Nebulization QID  . methylPREDNISolone (SOLU-MEDROL) injection  40 mg Intravenous Q12H  . pantoprazole  40 mg Oral Daily  . polyethylene glycol  17 g Oral BID  . sodium chloride  3 mL Intravenous Q12H  Continuous Infusions:   Principal Problem:   COPD exacerbation Active Problems:   Chronic respiratory failure with hypoxia   Atypical chest pain   Lung nodule   Tobacco use disorder    I, Marc Riley, acting as scribe, recorded this note contemporaneously in the presence of Dr. Melton Alar. Marc Riley, M.D. on 11/05/2014 .  I have reviewed the above documentation for accuracy and completeness, and I agree with the above. Marc Sacks, MD

## 2015-10-09 ENCOUNTER — Telehealth: Payer: Self-pay | Admitting: Family Medicine

## 2015-10-09 NOTE — Telephone Encounter (Signed)
error 

## 2017-04-04 IMAGING — CT CT CHEST W/ CM
2 of 3 series · 15 of 36 positions shown, 18 images · IV contrast (Omnipaque 300)
Comparison: Chest radiograph November 03, 2014

CLINICAL DATA: Productive cough and chest pain. Nodular opacity
seen on chest radiograph

EXAM:
CT CHEST WITH CONTRAST
TECHNIQUE: Multidetector CT imaging of the chest was performed during
intravenous contrast administration.
CONTRAST:  75mL OMNIPAQUE IOHEXOL 300 MG/ML  SOLN

[Series 2: chestroutine 5.0 b40f · axial · 0.78mm/px · z∈[-368,-74]mm · 12 of 71 slices shown, 15 images]
[im 6/71  mediastinal]
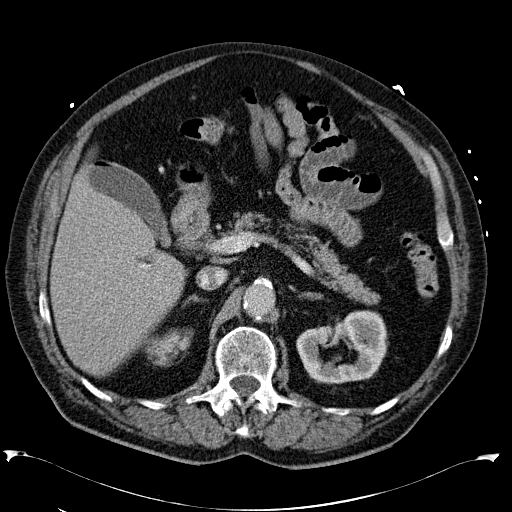
[im 6/71  lung]
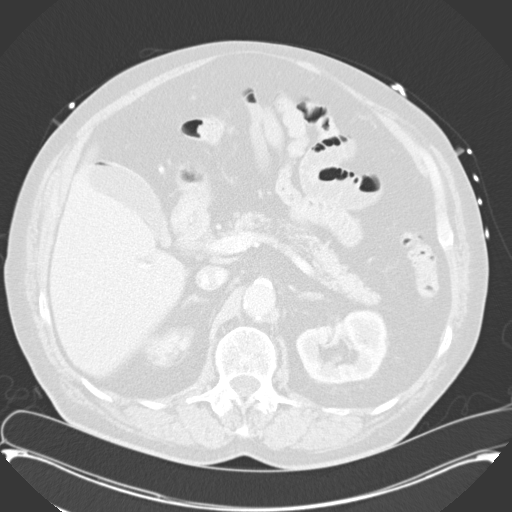
[im 11/71  lung]
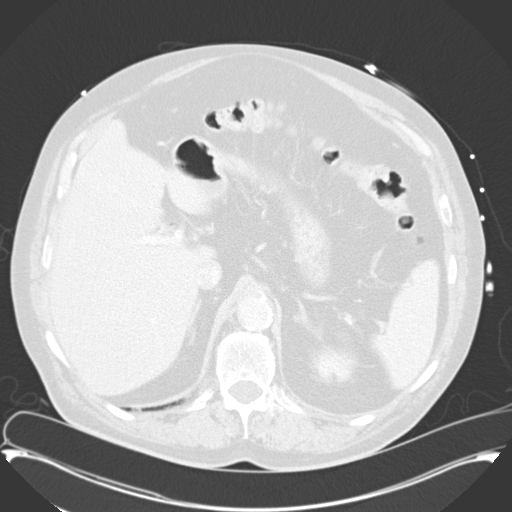
[im 16/71  lung]
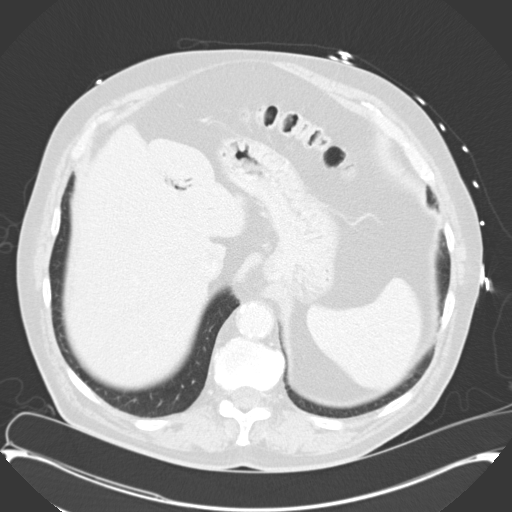
[im 21/71  lung]
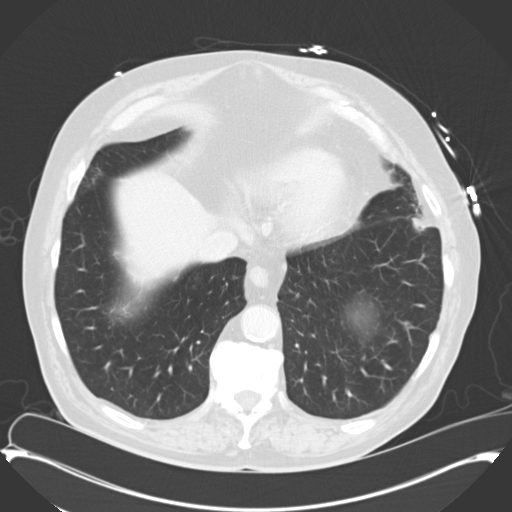
[im 26/71  mediastinal]
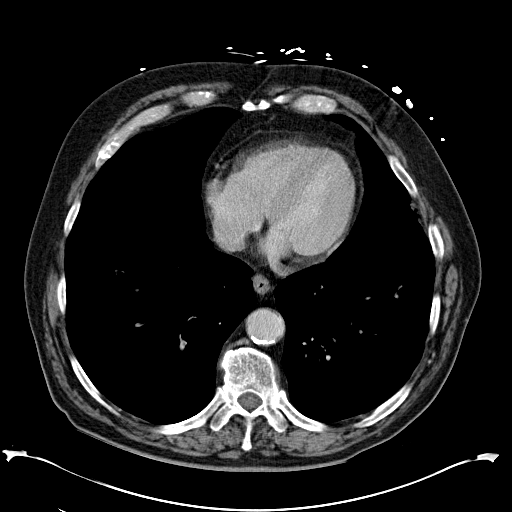
[im 26/71  lung]
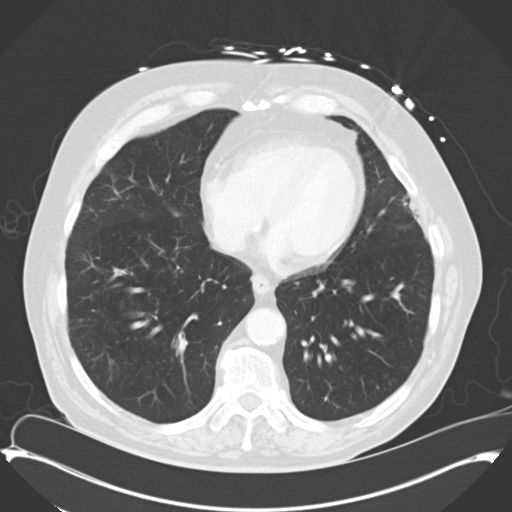
[im 32/71  lung]
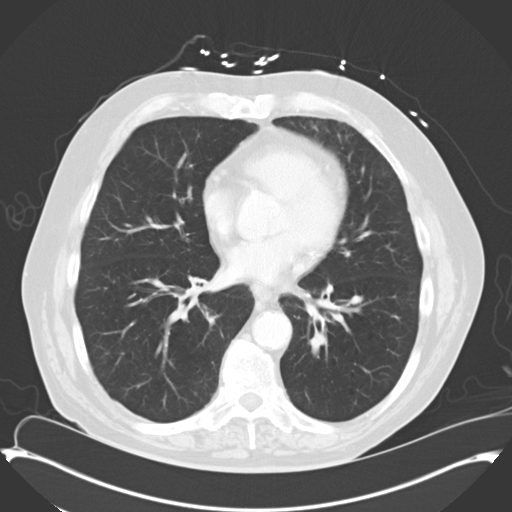
[im 39/71  lung]
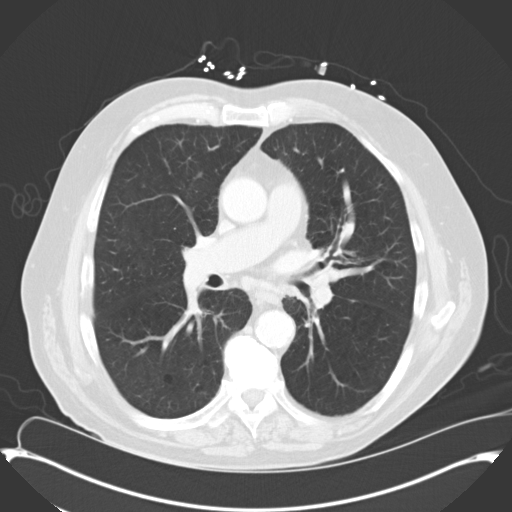
[im 45/71  lung]
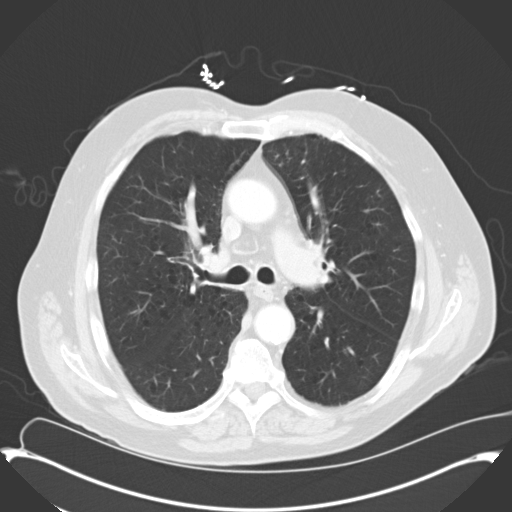
[im 50/71  mediastinal]
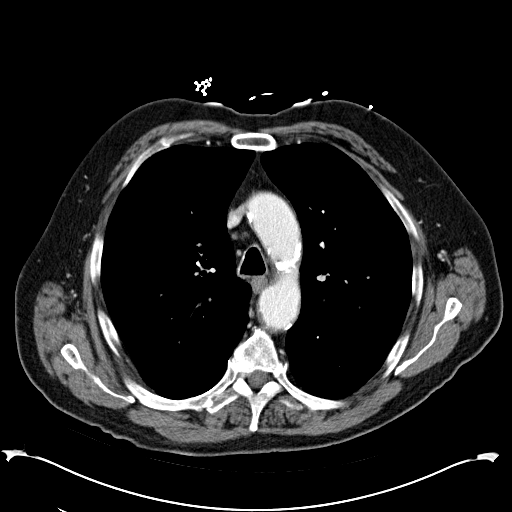
[im 50/71  lung]
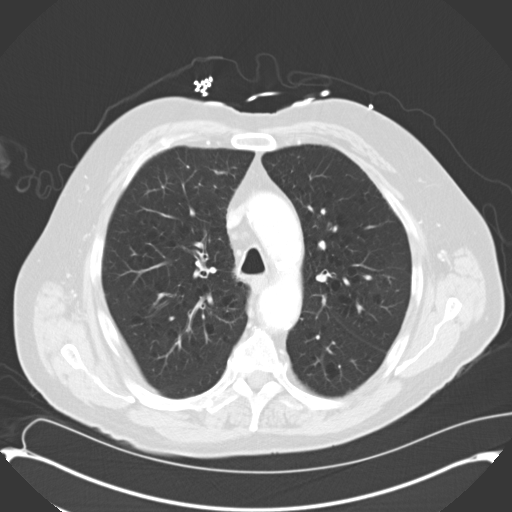
[im 55/71  lung]
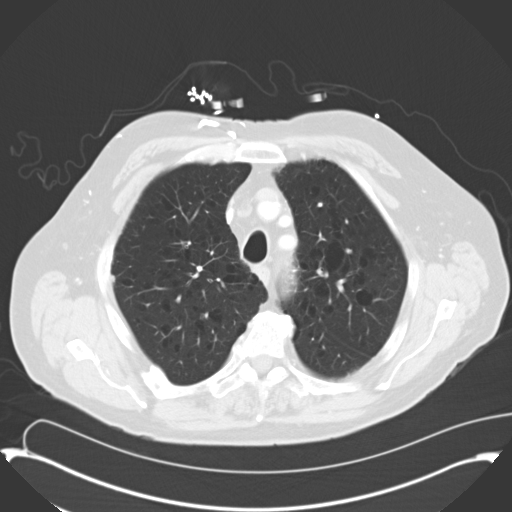
[im 60/71  lung]
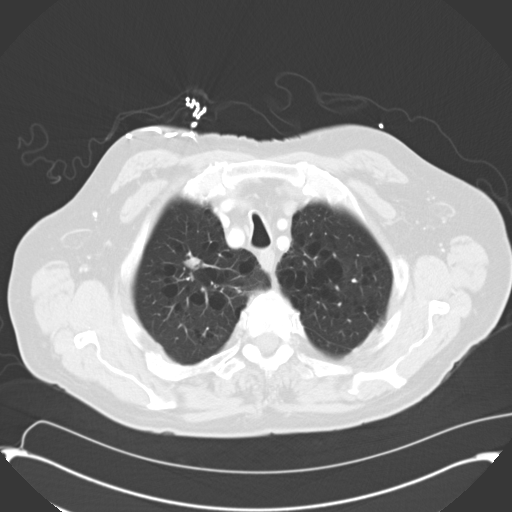
[im 65/71  lung]
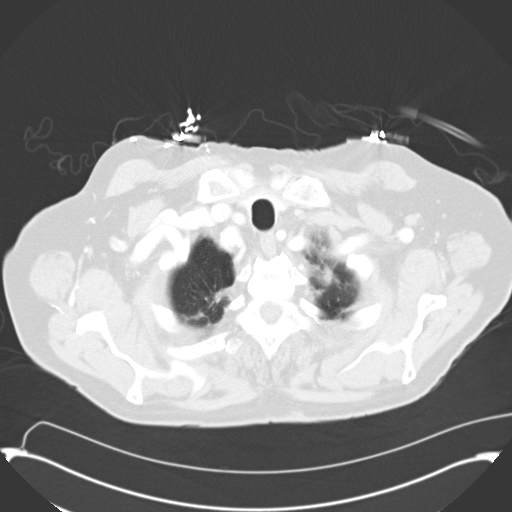

[Series 4: mpr coronal chest 3mm · coronal · 0.70mm/px · 3 of 116 slices shown]
[im 24/116  lung]
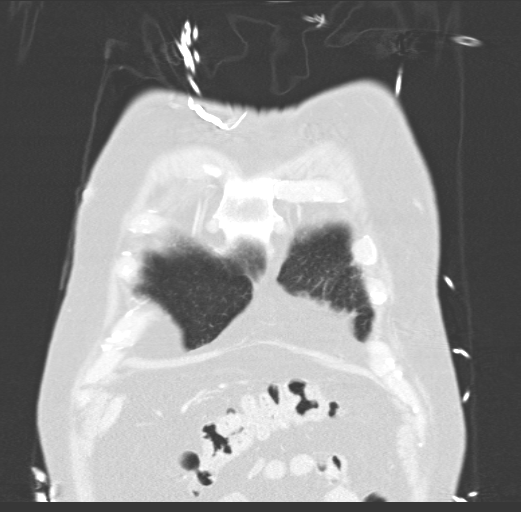
[im 47/116  lung]
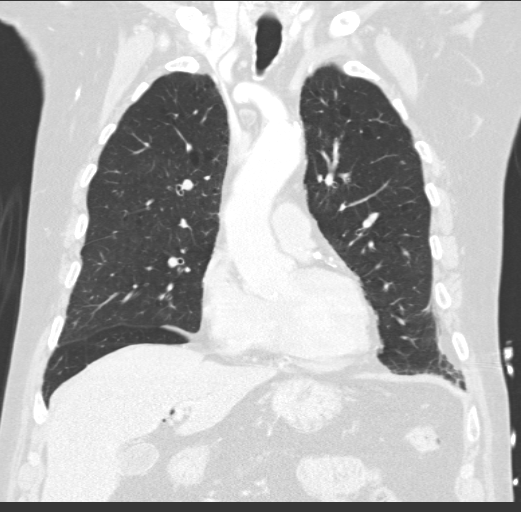
[im 70/116  lung]
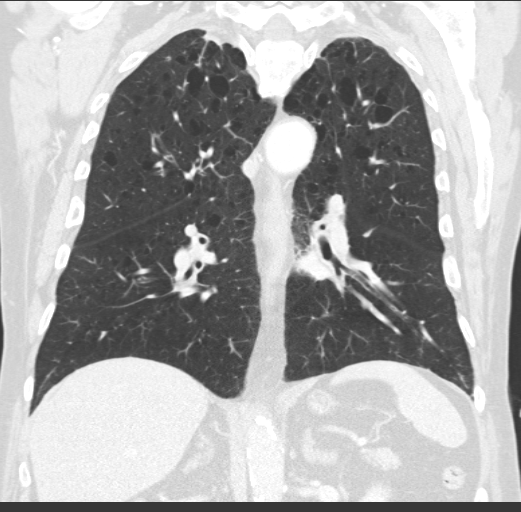

[15 of 36 positions shown; findings below may reference images not displayed]

FINDINGS: There is underlying centrilobular emphysema. There is and irregular
pulmonary nodular opacity in the apical

segment of the right upper lobe measuring 1.5 x 1.3 cm, best seen on
axial slice 11 series 3. On axial slice 9 series 3, there is a 7 x 7
mm slightly irregular nodular opacity in the apical segment right
upper lobe as well. On axial slice 10 series 3, there is a 2 mm
nodular opacity in the apical segment of the right upper lobe. On
axial slice 24 series 3, there is a 5 mm nodular opacity in the
anterior segment of the left upper lobe. On axial slice 34 series 3,
there is a 3 mm nodular opacity in the anterior segment of the right
upper lobe. On axial slice 39 series 3, there is a 2 mm nodular
opacity in the superior segment of the right lower lobe. On axial
slice 47 series 3, there is a 5 x 2 mm nodular opacity in the medial
segment right middle lobe. There is pleural thickening and scarring
in the anterior left base region. There are scattered areas of mild
pleural thickening in the upper lobe regions.

There is no appreciable thoracic adenopathy. Thyroid appears normal.
There is atherosclerotic change in the aorta without aneurysm or
dissection. There are foci of coronary artery calcification. No
major vessel pulmonary embolus. Pericardium is not thickened.

In the visualized upper abdomen, there is atherosclerotic change in
aorta. Visualized upper abdominal structures otherwise appear
unremarkable. Adrenals in particular appear normal. There is
degenerative change in thoracic spine. No blastic or lytic bone
lesions are identified on this study.
IMPRESSION: Underlying emphysema. No nodular opacity is seen in the region of
the nodular opacity noted on the chest radiograph. However, there is
a focal irregular nodular opacity in the apical segment right upper
lobe measuring 1.5 x 1.3 cm with a nearby 7 mm nodular opacity.
Neoplasm in this area is of concern. Several smaller nodular
opacities are of uncertain etiology but potentially could represent
small neoplastic foci. The dominant mass in the right upper lobe
apical segment may well warrant correlation with nuclear medicine
PET study to further evaluate given its irregular contour.

No lung edema or consolidation. No adenopathy appreciable. No
adrenal lesions. Atherosclerotic change with foci of coronary artery
calcification noted.

## 2017-04-04 IMAGING — DX DG CHEST 2V
2 series · 2 of 2 positions shown · non-contrast
Comparison: To 04/05/2009

CLINICAL DATA: Productive cough. Congestion. Shortness of breath.
Wheezing for 4 days. Worsening over the past 2 days.

EXAM:
CHEST  2 VIEW

[chest pa]
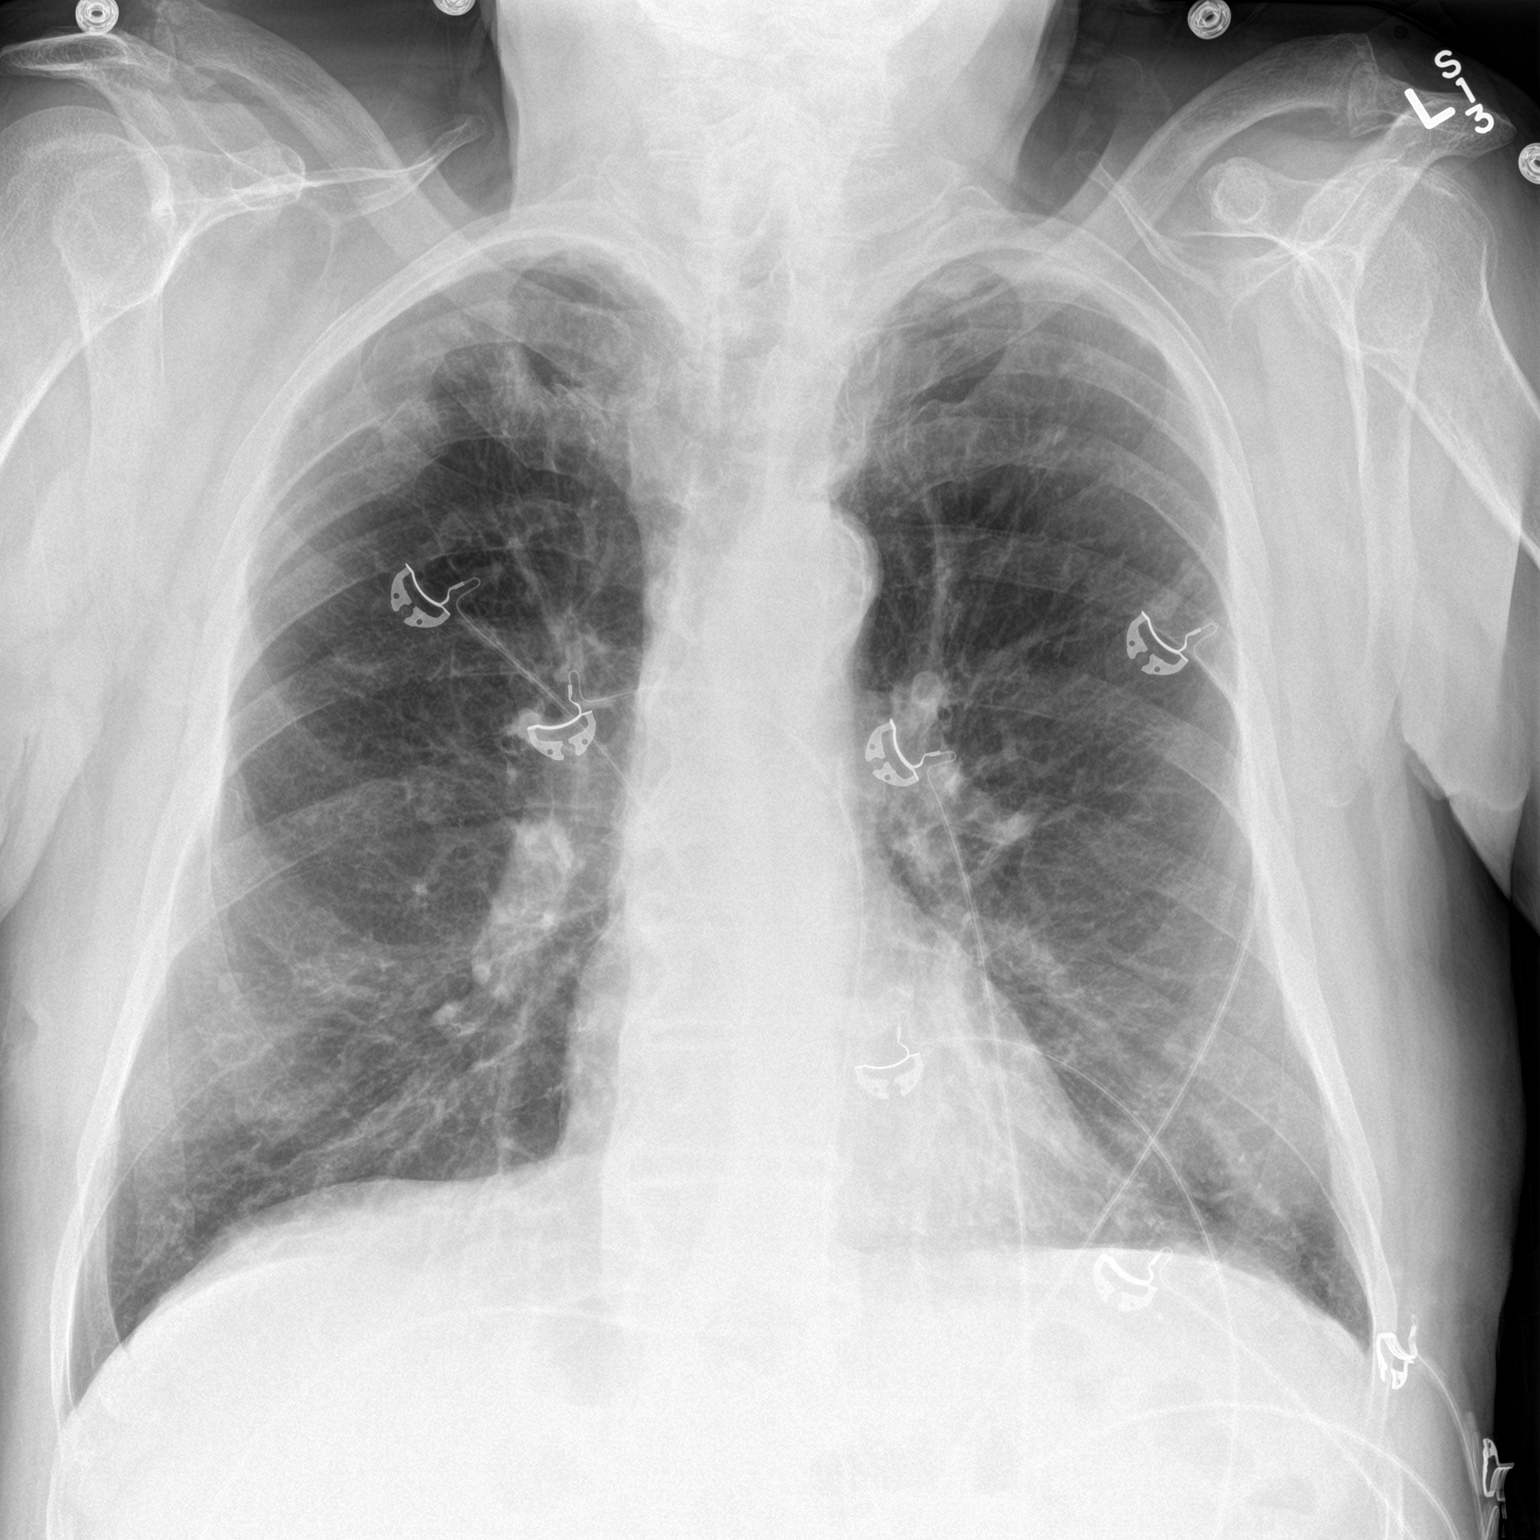

[chest lat]
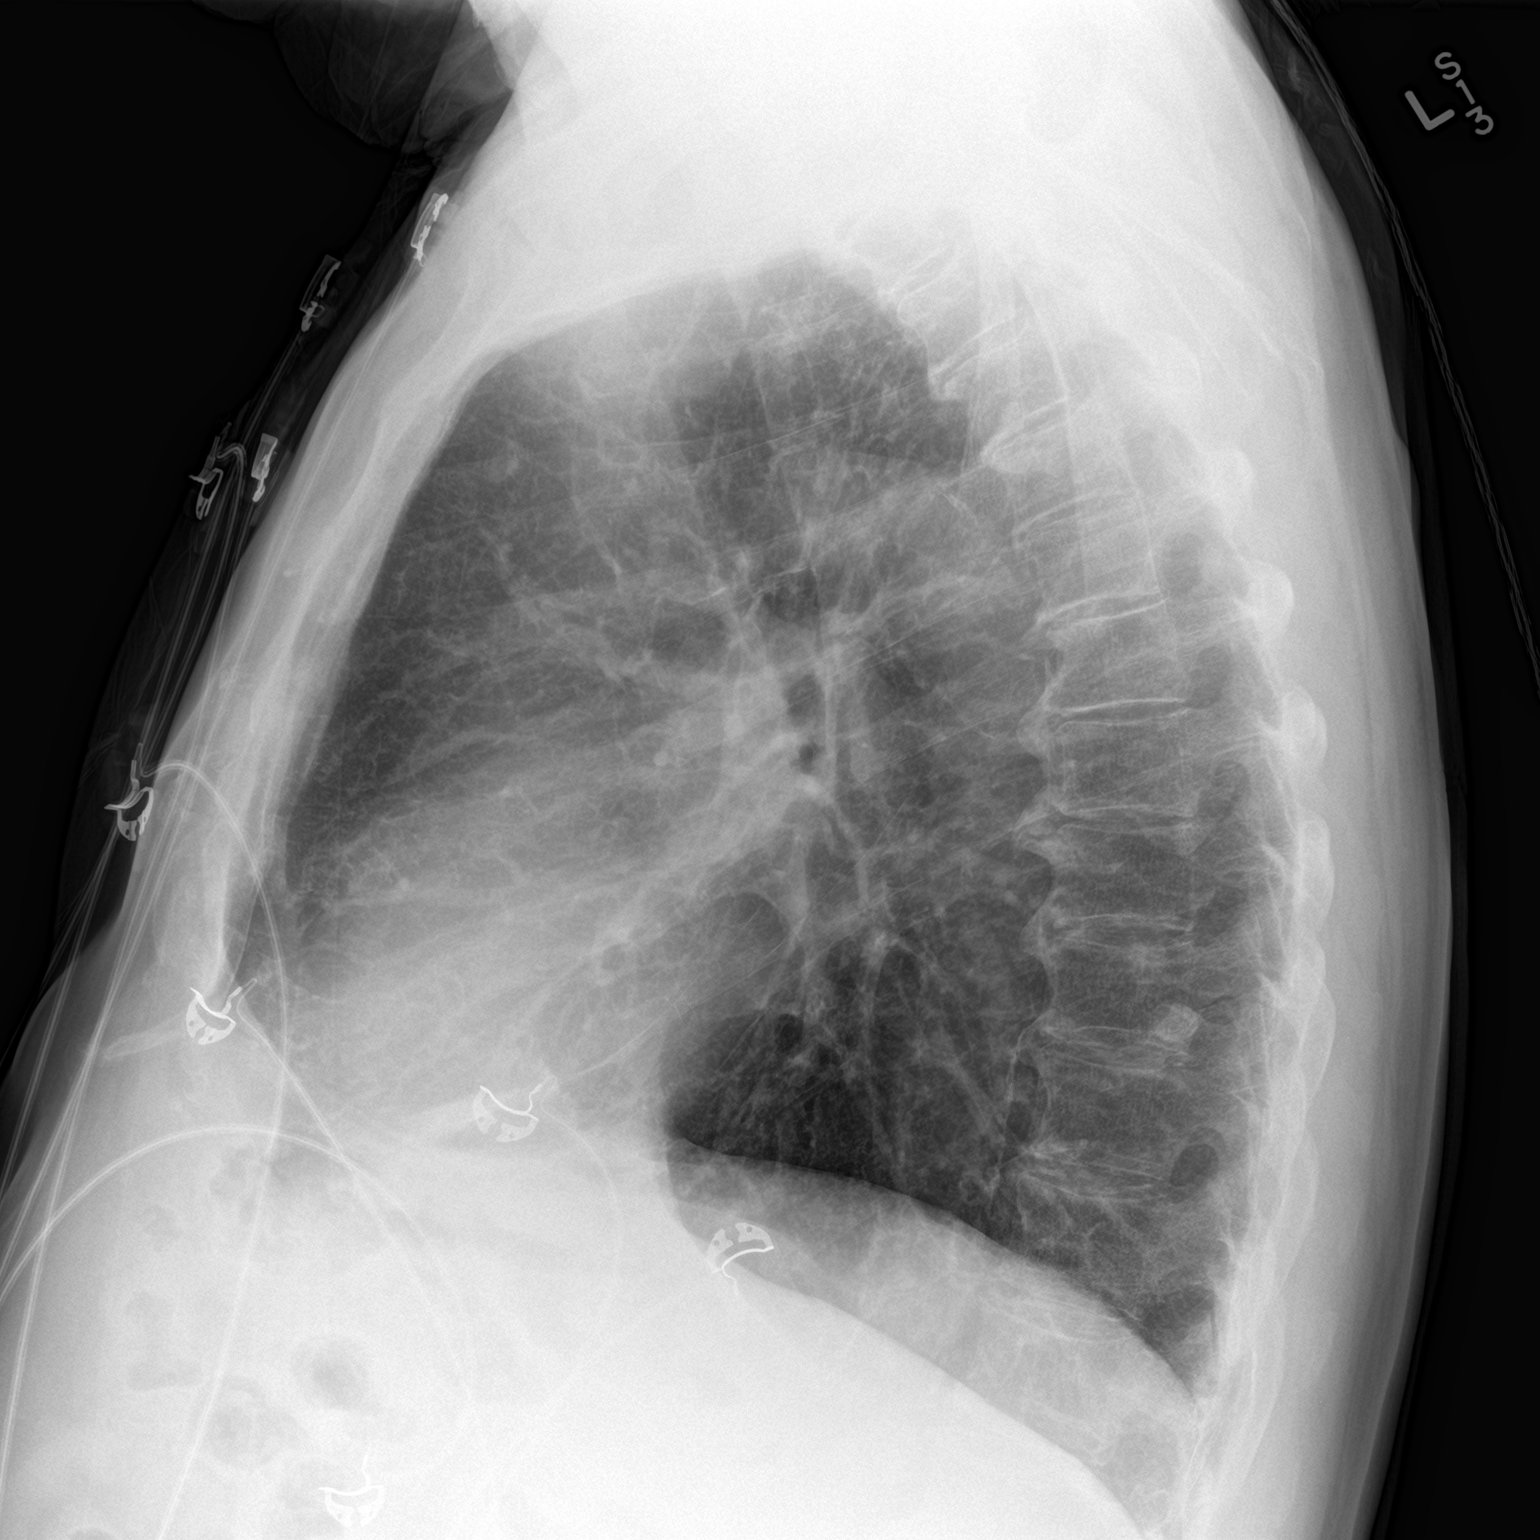

[2 of 2 positions shown; findings below may reference images not displayed]

FINDINGS: Healed right posterior third and fourth rib fractures.

Atherosclerotic calcification of the aortic arch.

Thoracic spondylosis.

Mild bilateral interstitial accentuation, possible underlying
emphysema. 1.2 cm nodular density projects over approximately the
T10 vertebra on the lateral projection, not well correlated on the
frontal projection.
IMPRESSION: 1. New 1.2 cm nodular density projects over the lower lobes on the
lateral projection. Possibly a calcified granuloma or calcified
structure, but I cannot completely exclude an intrapulmonary nodule.
CT the chest is recommended for further workup to exclude
malignancy.
2. Suspected emphysema.  Faint interstitial accentuation.
3. Healed right upper rib fractures.

## 2020-02-04 ENCOUNTER — Other Ambulatory Visit: Payer: Self-pay

## 2020-02-04 ENCOUNTER — Emergency Department
Admission: EM | Admit: 2020-02-04 | Discharge: 2020-02-04 | Disposition: A | Discharge status: Home / Self Care | Payer: Self-pay | Attending: Emergency Medicine | Admitting: Emergency Medicine

## 2020-02-04 ENCOUNTER — Emergency Department
Admission: EM | Admit: 2020-02-04 | Discharge: 2020-02-04 | Discharge status: Absent without leave | Payer: Medicare Other

## 2020-02-04 DIAGNOSIS — F0390 Unspecified dementia without behavioral disturbance: Secondary | ICD-10-CM | POA: Insufficient documentation

## 2020-02-04 DIAGNOSIS — R0602 Shortness of breath: Secondary | ICD-10-CM | POA: Insufficient documentation

## 2020-02-04 NOTE — ED Provider Notes (Signed)
[facility] Emergency Department Provider Note   ____________________________________________    I have reviewed the triage vital signs and the nursing notes.   HISTORY  Chief Complaint Shortness of Breath  Limited by dementia   HPI Marc Riley is a 86 y.o. male who was in route from [facility] to nursing home in [place] when transport staff became concerned that he was short of breath.  They felt that he was becoming hypoxic.  Patient is typically on 3 L nasal cannula.  They diverted to our emergency department to assess patient.  Patient has no complaints he states he feels well  No past medical history on file.  There are no problems to display for this patient.     Prior to Admission medications   Not on File     Allergies Patient has no known allergies.  No family history on file.  Social History Unavailable due to dementia Review of Systems  Constitutional: No reports of fever Eyes: No visual changes.  ENT: No sore throat. Cardiovascular: Denies chest pain. Respiratory: He denies shortness of Gastrointestinal: No abdominal pain.    Genitourinary: Negative for dysuria. Musculoskeletal: Negative for back pain. Skin: Negative for rash. Neurological: Negative for headaches   ____________________________________________   PHYSICAL EXAM:  VITAL SIGNS: ED Triage Vitals  Enc Vitals Group     BP 02/04/20 1053 (!) 150/74     Pulse Rate 02/04/20 1053 66     Resp 02/04/20 1053 20     Temp 02/04/20 1053 97.7 F (36.5 C)     Temp Source 02/04/20 1053 Oral     SpO2 02/04/20 1053 98 %     Weight --      Height --      Head Circumference --      Peak Flow --      Pain Score 02/04/20 1054 0     Pain Loc --      Pain Edu? --      Excl. in GC? --     Constitutional: Alert and oriented. No acute distress. Pleasant and interactive Eyes: Conjunctivae are normal.  Head: Atraumatic. Nose: No  congestion/rhinnorhea. Mouth/Throat: Mucous membranes are moist.    Cardiovascular: Normal rate, regular rhythm. Grossly normal heart sounds.  Good peripheral circulation. Respiratory: No increased respiratory effort,.  No retractions. Lungs CTAB.  Very reassuring exam Gastrointestinal: Soft and nontender. No distention.  No CVA tenderness.  Musculoskeletal: No lower extremity tenderness nor edema.  Warm and well perfused Neurologic:  Normal speech and language. No gross focal neurologic deficits are appreciated.  Skin:  Skin is warm, dry and intact. No rash noted. Psychiatric: Mood and affect are normal. Speech and behavior are normal.  ____________________________________________   LABS (all labs ordered are listed, but only abnormal results are displayed)  Labs Reviewed - No data to display ____________________________________________  EKG  None ____________________________________________  RADIOLOGY  None ____________________________________________   PROCEDURES  Procedure(s) performed: No  Procedures   Critical Care performed: No ____________________________________________   INITIAL IMPRESSION / ASSESSMENT AND PLAN / ED COURSE  Pertinent labs & imaging results that were available during my care of the patient were reviewed by me and considered in my medical decision making (see chart for details).  Patient brought in for evaluation because of concerns for shortness of breath.  Upon arrival very reassured by patient presentation.  No tachypnea or increased work of breathing.  No retractions.  Clear to auscultation on exam.  Respiratory rate of 20.  98 to 100% on room air, however patient is typically on 3 L nasal cannula.  Heart rate is normal.  No indication for further work-up I suspect possible error with pulse oximetry by transport staff or mild bronchospasm  Appropriate for discharge with continued transport to nursing facility, contacted the [facility] at  [facility] nursing facility and let them know that he is doing well and is in route.  Discussed with chief medical officer who agrees with plan    ____________________________________________   FINAL CLINICAL IMPRESSION(S) / ED DIAGNOSES  Final diagnoses:  Shortness of breath        Note:  This document was prepared using Dragon voice recognition software and may include unintentional dictation errors.   Marc Every, MD 02/04/20 1101

## 2020-02-04 NOTE — ED Triage Notes (Signed)
Pt en route via [facility] from [facility] to [facility] ([facility]) when pt became sob and had "coughing spell" with sats that dropped to upper 70's-low80's on 3LNC. Pt was given 2.5mg  albuterol and placed on NRB. When pt arrived he was taken off of the NRB and had a 3L [place] sat of 98%. Pt arrived in NAD, denies any sob.

## 2022-11-03 DEATH — deceased
